# Patient Record
Sex: Male | Born: 1970 | Race: White | Hispanic: No | Marital: Married
Health system: Southern US, Community
[De-identification: ages and names within clinical notes are randomized; demographics above are authoritative.]

---

## 2014-06-26 ENCOUNTER — Emergency Department: Payer: Self-pay | Admitting: Emergency Medicine
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
## 2016-01-04 DIAGNOSIS — C4441 Basal cell carcinoma of skin of scalp and neck: Secondary | ICD-10-CM | POA: Diagnosis not present

## 2017-01-03 DIAGNOSIS — Z0000 Encounter for general adult medical examination without abnormal findings: Secondary | ICD-10-CM | POA: Diagnosis not present

## 2017-01-03 DIAGNOSIS — Z131 Encounter for screening for diabetes mellitus: Secondary | ICD-10-CM | POA: Diagnosis not present

## 2017-01-03 DIAGNOSIS — Z13220 Encounter for screening for lipoid disorders: Secondary | ICD-10-CM | POA: Diagnosis not present

## 2017-01-03 DIAGNOSIS — Z1211 Encounter for screening for malignant neoplasm of colon: Secondary | ICD-10-CM | POA: Diagnosis not present

## 2017-05-09 DIAGNOSIS — D225 Melanocytic nevi of trunk: Secondary | ICD-10-CM | POA: Diagnosis not present

## 2017-05-09 DIAGNOSIS — L821 Other seborrheic keratosis: Secondary | ICD-10-CM | POA: Diagnosis not present

## 2018-05-06 DIAGNOSIS — D1801 Hemangioma of skin and subcutaneous tissue: Secondary | ICD-10-CM | POA: Diagnosis not present

## 2018-05-06 DIAGNOSIS — L821 Other seborrheic keratosis: Secondary | ICD-10-CM | POA: Diagnosis not present

## 2018-05-06 DIAGNOSIS — D2239 Melanocytic nevi of other parts of face: Secondary | ICD-10-CM | POA: Diagnosis not present

## 2018-05-06 DIAGNOSIS — L820 Inflamed seborrheic keratosis: Secondary | ICD-10-CM | POA: Diagnosis not present

## 2018-05-06 DIAGNOSIS — D225 Melanocytic nevi of trunk: Secondary | ICD-10-CM | POA: Diagnosis not present

## 2018-12-05 DIAGNOSIS — J019 Acute sinusitis, unspecified: Secondary | ICD-10-CM | POA: Diagnosis not present

## 2018-12-05 DIAGNOSIS — B9689 Other specified bacterial agents as the cause of diseases classified elsewhere: Secondary | ICD-10-CM | POA: Diagnosis not present

## 2018-12-05 DIAGNOSIS — R52 Pain, unspecified: Secondary | ICD-10-CM | POA: Diagnosis not present

## 2018-12-05 DIAGNOSIS — J40 Bronchitis, not specified as acute or chronic: Secondary | ICD-10-CM | POA: Diagnosis not present

## 2019-09-23 DIAGNOSIS — K5730 Diverticulosis of large intestine without perforation or abscess without bleeding: Secondary | ICD-10-CM | POA: Diagnosis not present

## 2019-09-23 DIAGNOSIS — Z1211 Encounter for screening for malignant neoplasm of colon: Secondary | ICD-10-CM | POA: Diagnosis not present

## 2019-09-23 DIAGNOSIS — Z8371 Family history of colonic polyps: Secondary | ICD-10-CM | POA: Diagnosis not present

## 2019-09-29 ENCOUNTER — Ambulatory Visit: Payer: Self-pay | Admitting: Adult Health

## 2019-10-30 ENCOUNTER — Ambulatory Visit: Payer: Self-pay | Admitting: Adult Health

## 2019-11-04 DIAGNOSIS — D225 Melanocytic nevi of trunk: Secondary | ICD-10-CM | POA: Diagnosis not present

## 2019-11-04 DIAGNOSIS — L820 Inflamed seborrheic keratosis: Secondary | ICD-10-CM | POA: Diagnosis not present

## 2019-11-30 ENCOUNTER — Ambulatory Visit: Payer: Self-pay | Admitting: Adult Health

## 2020-02-11 DIAGNOSIS — L820 Inflamed seborrheic keratosis: Secondary | ICD-10-CM | POA: Diagnosis not present

## 2020-07-26 DIAGNOSIS — Z1152 Encounter for screening for COVID-19: Secondary | ICD-10-CM | POA: Diagnosis not present

## 2020-07-26 DIAGNOSIS — Z03818 Encounter for observation for suspected exposure to other biological agents ruled out: Secondary | ICD-10-CM | POA: Diagnosis not present

## 2020-08-26 DIAGNOSIS — Z125 Encounter for screening for malignant neoplasm of prostate: Secondary | ICD-10-CM | POA: Diagnosis not present

## 2020-08-26 DIAGNOSIS — Z7689 Persons encountering health services in other specified circumstances: Secondary | ICD-10-CM | POA: Diagnosis not present

## 2020-08-26 DIAGNOSIS — Z131 Encounter for screening for diabetes mellitus: Secondary | ICD-10-CM | POA: Diagnosis not present

## 2020-08-26 DIAGNOSIS — Z13220 Encounter for screening for lipoid disorders: Secondary | ICD-10-CM | POA: Diagnosis not present

## 2020-08-26 DIAGNOSIS — Z0000 Encounter for general adult medical examination without abnormal findings: Secondary | ICD-10-CM | POA: Diagnosis not present

## 2021-02-19 DIAGNOSIS — Z20822 Contact with and (suspected) exposure to covid-19: Secondary | ICD-10-CM | POA: Diagnosis not present

## 2021-02-28 DIAGNOSIS — S80861A Insect bite (nonvenomous), right lower leg, initial encounter: Secondary | ICD-10-CM | POA: Diagnosis not present

## 2021-02-28 DIAGNOSIS — Z6829 Body mass index (BMI) 29.0-29.9, adult: Secondary | ICD-10-CM | POA: Diagnosis not present

## 2021-02-28 DIAGNOSIS — L723 Sebaceous cyst: Secondary | ICD-10-CM | POA: Diagnosis not present

## 2021-02-28 DIAGNOSIS — W57XXXA Bitten or stung by nonvenomous insect and other nonvenomous arthropods, initial encounter: Secondary | ICD-10-CM | POA: Diagnosis not present

## 2021-05-17 DIAGNOSIS — D225 Melanocytic nevi of trunk: Secondary | ICD-10-CM | POA: Diagnosis not present

## 2021-05-17 DIAGNOSIS — L814 Other melanin hyperpigmentation: Secondary | ICD-10-CM | POA: Diagnosis not present

## 2021-05-17 DIAGNOSIS — L820 Inflamed seborrheic keratosis: Secondary | ICD-10-CM | POA: Diagnosis not present

## 2021-07-10 DIAGNOSIS — R42 Dizziness and giddiness: Secondary | ICD-10-CM | POA: Diagnosis not present

## 2021-07-10 DIAGNOSIS — H9313 Tinnitus, bilateral: Secondary | ICD-10-CM | POA: Diagnosis not present

## 2021-07-10 DIAGNOSIS — R110 Nausea: Secondary | ICD-10-CM | POA: Diagnosis not present

## 2021-07-10 DIAGNOSIS — F418 Other specified anxiety disorders: Secondary | ICD-10-CM | POA: Diagnosis not present

## 2021-07-11 DIAGNOSIS — H9313 Tinnitus, bilateral: Secondary | ICD-10-CM | POA: Diagnosis not present

## 2021-07-11 DIAGNOSIS — H8111 Benign paroxysmal vertigo, right ear: Secondary | ICD-10-CM | POA: Diagnosis not present

## 2021-07-11 DIAGNOSIS — H903 Sensorineural hearing loss, bilateral: Secondary | ICD-10-CM | POA: Diagnosis not present

## 2021-07-18 DIAGNOSIS — R42 Dizziness and giddiness: Secondary | ICD-10-CM | POA: Diagnosis not present

## 2021-10-31 DIAGNOSIS — L820 Inflamed seborrheic keratosis: Secondary | ICD-10-CM | POA: Diagnosis not present

## 2021-10-31 DIAGNOSIS — D485 Neoplasm of uncertain behavior of skin: Secondary | ICD-10-CM | POA: Diagnosis not present

## 2021-10-31 DIAGNOSIS — D225 Melanocytic nevi of trunk: Secondary | ICD-10-CM | POA: Diagnosis not present

## 2022-02-19 DIAGNOSIS — Z0000 Encounter for general adult medical examination without abnormal findings: Secondary | ICD-10-CM | POA: Diagnosis not present

## 2022-02-19 DIAGNOSIS — Z13220 Encounter for screening for lipoid disorders: Secondary | ICD-10-CM | POA: Diagnosis not present

## 2022-02-19 DIAGNOSIS — Z1211 Encounter for screening for malignant neoplasm of colon: Secondary | ICD-10-CM | POA: Diagnosis not present

## 2022-02-19 DIAGNOSIS — Z125 Encounter for screening for malignant neoplasm of prostate: Secondary | ICD-10-CM | POA: Diagnosis not present

## 2022-02-19 DIAGNOSIS — Z6829 Body mass index (BMI) 29.0-29.9, adult: Secondary | ICD-10-CM | POA: Diagnosis not present

## 2022-02-19 DIAGNOSIS — Z1331 Encounter for screening for depression: Secondary | ICD-10-CM | POA: Diagnosis not present

## 2022-02-19 DIAGNOSIS — Z131 Encounter for screening for diabetes mellitus: Secondary | ICD-10-CM | POA: Diagnosis not present

## 2022-05-28 DIAGNOSIS — L728 Other follicular cysts of the skin and subcutaneous tissue: Secondary | ICD-10-CM | POA: Diagnosis not present

## 2022-05-28 DIAGNOSIS — D225 Melanocytic nevi of trunk: Secondary | ICD-10-CM | POA: Diagnosis not present

## 2022-05-28 DIAGNOSIS — D2239 Melanocytic nevi of other parts of face: Secondary | ICD-10-CM | POA: Diagnosis not present

## 2022-05-28 DIAGNOSIS — L814 Other melanin hyperpigmentation: Secondary | ICD-10-CM | POA: Diagnosis not present

## 2022-07-24 DIAGNOSIS — Z6829 Body mass index (BMI) 29.0-29.9, adult: Secondary | ICD-10-CM | POA: Diagnosis not present

## 2022-07-24 DIAGNOSIS — M546 Pain in thoracic spine: Secondary | ICD-10-CM | POA: Diagnosis not present

## 2022-07-30 DIAGNOSIS — M9908 Segmental and somatic dysfunction of rib cage: Secondary | ICD-10-CM | POA: Diagnosis not present

## 2022-07-30 DIAGNOSIS — S2341XA Sprain of ribs, initial encounter: Secondary | ICD-10-CM | POA: Diagnosis not present

## 2022-07-30 DIAGNOSIS — S29012A Strain of muscle and tendon of back wall of thorax, initial encounter: Secondary | ICD-10-CM | POA: Diagnosis not present

## 2022-07-30 DIAGNOSIS — M62830 Muscle spasm of back: Secondary | ICD-10-CM | POA: Diagnosis not present

## 2022-07-30 DIAGNOSIS — M9903 Segmental and somatic dysfunction of lumbar region: Secondary | ICD-10-CM | POA: Diagnosis not present

## 2022-07-30 DIAGNOSIS — M9902 Segmental and somatic dysfunction of thoracic region: Secondary | ICD-10-CM | POA: Diagnosis not present

## 2022-08-02 DIAGNOSIS — M62830 Muscle spasm of back: Secondary | ICD-10-CM | POA: Diagnosis not present

## 2022-08-02 DIAGNOSIS — S29012A Strain of muscle and tendon of back wall of thorax, initial encounter: Secondary | ICD-10-CM | POA: Diagnosis not present

## 2022-08-02 DIAGNOSIS — M9902 Segmental and somatic dysfunction of thoracic region: Secondary | ICD-10-CM | POA: Diagnosis not present

## 2022-08-02 DIAGNOSIS — M9908 Segmental and somatic dysfunction of rib cage: Secondary | ICD-10-CM | POA: Diagnosis not present

## 2022-08-02 DIAGNOSIS — M9903 Segmental and somatic dysfunction of lumbar region: Secondary | ICD-10-CM | POA: Diagnosis not present

## 2022-08-02 DIAGNOSIS — S2341XA Sprain of ribs, initial encounter: Secondary | ICD-10-CM | POA: Diagnosis not present

## 2022-08-06 DIAGNOSIS — S2341XA Sprain of ribs, initial encounter: Secondary | ICD-10-CM | POA: Diagnosis not present

## 2022-08-06 DIAGNOSIS — M9902 Segmental and somatic dysfunction of thoracic region: Secondary | ICD-10-CM | POA: Diagnosis not present

## 2022-08-06 DIAGNOSIS — M9903 Segmental and somatic dysfunction of lumbar region: Secondary | ICD-10-CM | POA: Diagnosis not present

## 2022-08-06 DIAGNOSIS — M62830 Muscle spasm of back: Secondary | ICD-10-CM | POA: Diagnosis not present

## 2022-08-06 DIAGNOSIS — M9908 Segmental and somatic dysfunction of rib cage: Secondary | ICD-10-CM | POA: Diagnosis not present

## 2022-08-06 DIAGNOSIS — S29012A Strain of muscle and tendon of back wall of thorax, initial encounter: Secondary | ICD-10-CM | POA: Diagnosis not present

## 2022-08-07 DIAGNOSIS — F419 Anxiety disorder, unspecified: Secondary | ICD-10-CM | POA: Diagnosis not present

## 2022-08-08 DIAGNOSIS — M9908 Segmental and somatic dysfunction of rib cage: Secondary | ICD-10-CM | POA: Diagnosis not present

## 2022-08-08 DIAGNOSIS — M9902 Segmental and somatic dysfunction of thoracic region: Secondary | ICD-10-CM | POA: Diagnosis not present

## 2022-08-08 DIAGNOSIS — M62830 Muscle spasm of back: Secondary | ICD-10-CM | POA: Diagnosis not present

## 2022-08-08 DIAGNOSIS — S29012A Strain of muscle and tendon of back wall of thorax, initial encounter: Secondary | ICD-10-CM | POA: Diagnosis not present

## 2022-08-08 DIAGNOSIS — S2341XA Sprain of ribs, initial encounter: Secondary | ICD-10-CM | POA: Diagnosis not present

## 2022-08-08 DIAGNOSIS — M9903 Segmental and somatic dysfunction of lumbar region: Secondary | ICD-10-CM | POA: Diagnosis not present

## 2022-08-14 DIAGNOSIS — M9903 Segmental and somatic dysfunction of lumbar region: Secondary | ICD-10-CM | POA: Diagnosis not present

## 2022-08-14 DIAGNOSIS — S2341XA Sprain of ribs, initial encounter: Secondary | ICD-10-CM | POA: Diagnosis not present

## 2022-08-14 DIAGNOSIS — M62830 Muscle spasm of back: Secondary | ICD-10-CM | POA: Diagnosis not present

## 2022-08-14 DIAGNOSIS — M9902 Segmental and somatic dysfunction of thoracic region: Secondary | ICD-10-CM | POA: Diagnosis not present

## 2022-08-14 DIAGNOSIS — S29012A Strain of muscle and tendon of back wall of thorax, initial encounter: Secondary | ICD-10-CM | POA: Diagnosis not present

## 2022-08-14 DIAGNOSIS — M9908 Segmental and somatic dysfunction of rib cage: Secondary | ICD-10-CM | POA: Diagnosis not present

## 2022-08-22 DIAGNOSIS — R030 Elevated blood-pressure reading, without diagnosis of hypertension: Secondary | ICD-10-CM | POA: Diagnosis not present

## 2022-08-22 DIAGNOSIS — R009 Unspecified abnormalities of heart beat: Secondary | ICD-10-CM | POA: Diagnosis not present

## 2022-08-22 DIAGNOSIS — F419 Anxiety disorder, unspecified: Secondary | ICD-10-CM | POA: Diagnosis not present

## 2022-08-22 DIAGNOSIS — Z6828 Body mass index (BMI) 28.0-28.9, adult: Secondary | ICD-10-CM | POA: Diagnosis not present

## 2022-10-11 ENCOUNTER — Other Ambulatory Visit: Payer: Self-pay

## 2022-10-11 ENCOUNTER — Emergency Department
Admission: EM | Admit: 2022-10-11 | Discharge: 2022-10-11 | Disposition: A | Discharge status: Home / Self Care | Payer: BC Managed Care – PPO | Attending: Student in an Organized Health Care Education/Training Program | Admitting: Student in an Organized Health Care Education/Training Program

## 2022-10-11 ENCOUNTER — Emergency Department: Payer: BC Managed Care – PPO

## 2022-10-11 DIAGNOSIS — R079 Chest pain, unspecified: Secondary | ICD-10-CM | POA: Diagnosis not present

## 2022-10-11 DIAGNOSIS — R000 Tachycardia, unspecified: Secondary | ICD-10-CM | POA: Insufficient documentation

## 2022-10-11 LAB — BASIC METABOLIC PANEL
Anion gap: 8 (ref 5–15)
BUN: 16 mg/dL (ref 6–20)
CO2: 26 mmol/L (ref 22–32)
Calcium: 9.1 mg/dL (ref 8.9–10.3)
Chloride: 103 mmol/L (ref 98–111)
Creatinine, Ser: 0.92 mg/dL (ref 0.61–1.24)
GFR, Estimated: >60 mL/min (ref >60)
Glucose, Bld: 90 mg/dL (ref 70–99)
Potassium: 4.1 mmol/L (ref 3.5–5.1)
Sodium: 137 mmol/L (ref 135–145)

## 2022-10-11 LAB — CBC
HCT: 44 % (ref 39.0–52.0)
Hemoglobin: 15.6 g/dL (ref 13.0–17.0)
MCH: 32.6 pg (ref 26.0–34.0)
MCHC: 35.5 g/dL (ref 30.0–36.0)
MCV: 92.1 fL (ref 80.0–100.0)
Platelets: 256 10*3/uL (ref 150–400)
RBC: 4.78 MIL/uL (ref 4.22–5.81)
RDW: 11.4 % — ABNORMAL LOW (ref 11.5–15.5)
WBC: 9.7 10*3/uL (ref 4.0–10.5)
nRBC: 0 % (ref 0.0–0.2)

## 2022-10-11 LAB — TROPONIN I (HIGH SENSITIVITY): Troponin I (High Sensitivity): 3 ng/L (ref <18)

## 2022-10-11 NOTE — ED Provider Triage Note (Signed)
Emergency Medicine Provider Triage Evaluation Note  Dale Bass , a 52 y.o. male  was evaluated in triage.  Pt complains of tachycardia. While on the elliptical, heart rate suddenly jumped from 130s to 200. No chest pain or shortness of breath   Physical Exam  BP (!) 161/99 (BP Location: Right Arm)   Pulse 87   Resp 17   SpO2 98%  Gen:   Awake, no distress   Resp:  Normal effort  MSK:   Moves extremities without difficulty  Other:    Medical Decision Making  Medically screening exam initiated at 6:32 PM.  Appropriate orders placed.  Dale Bass was informed that the remainder of the evaluation will be completed by another provider, this initial triage assessment does not replace that evaluation, and the importance of remaining in the ED until their evaluation is complete.    Victorino Dike, FNP 10/11/22 [phone]

## 2022-10-11 NOTE — ED Triage Notes (Addendum)
Pt presents to ED with /co of having a high heart rate while working out. Pt states he was on the elliptical and states the machine told him his HR was in the 200's. Pt HR now is in the 80's. Pt denies CP and palpitations. Pt is A&Ox4, NAD noted.    Pt does state he recently started taking lexapro but stopped due to increased HR.   Pt does state HX of anxiety.

## 2022-10-11 NOTE — ED Provider Notes (Signed)
[facility] Provider Note   CSN: [id number] Arrival date & time: 10/11/22  1801     History  Chief Complaint  Patient presents with   Tachycardia    Dale Bass is a 52 y.o. male with medical history of anxiety presents to the emergency department for evaluation elevated heart rate.  Patient states earlier today, he was on a treadmill and his heart rate was being monitored by the treadmill.  He states he was jogging for 15 minutes and his heart rate stayed around 130s but then suddenly jumped up to the 200s, he describes 200-2 05.  Patient states he got off drink some water and returned to a different treadmill and again noticed his heart rate close to 200.  He denies any reports of chest pain shortness of breath dizziness fevers chills.  He does admit to having anxiety and is uncertain if he had a panic attack.  He denies any cardiac history.  At no point while on the treadmill and while his heart rate was elevated did he notice any symptoms other than palpitations.  He denies any nausea, vomiting, diaphoresis, back pain, abdominal pain.  Patient was placed on Lexapro by PCP 2 weeks ago for anxiety and whitecoat syndrome, patient took the medication daily for 1 week and did not like the way it made him feel so he discontinued the medication abruptly and has been off of it for 1 week.  HPI     Home Medications Prior to Admission medications   Not on File      Allergies    Patient has no known allergies.    Review of Systems   Review of Systems  Physical Exam Updated Vital Signs BP (!) 146/92   Pulse 77   Temp 98.4 F (36.9 C) (Oral)   Resp 16   Ht 5' 9.5" (1.765 m)   Wt 83.5 kg   SpO2 99%   BMI 26.78 kg/m  Physical Exam Constitutional:      Appearance: He is well-developed.  HENT:     Head: Normocephalic and atraumatic.  Eyes:     Conjunctiva/sclera: Conjunctivae normal.  Cardiovascular:     Rate and Rhythm:  Normal rate and regular rhythm.     Pulses: Normal pulses.     Heart sounds: Normal heart sounds. No murmur heard.    No friction rub. No gallop.  Pulmonary:     Effort: Pulmonary effort is normal. No respiratory distress.     Breath sounds: No wheezing or rales.  Abdominal:     General: Bowel sounds are normal. There is no distension.     Tenderness: There is no abdominal tenderness. There is no right CVA tenderness or guarding.  Musculoskeletal:        General: Normal range of motion.     Cervical back: Normal range of motion.  Skin:    General: Skin is warm.     Findings: No rash.  Neurological:     Mental Status: He is alert and oriented to person, place, and time.  Psychiatric:        Behavior: Behavior normal.        Thought Content: Thought content normal.     ED Results / Procedures / Treatments   Labs (all labs ordered are listed, but only abnormal results are displayed) Labs Reviewed  CBC - Abnormal; Notable for the following components:      Result Value   RDW 11.4 (*)  All other components within normal limits  BASIC METABOLIC PANEL  TROPONIN I (HIGH SENSITIVITY)  TROPONIN I (HIGH SENSITIVITY)    EKG None  Radiology DG Chest 2 View  Result Date: 10/11/2022 CLINICAL DATA:  Chest pain EXAM: CHEST - 2 VIEW COMPARISON:  None Available. FINDINGS: The heart size and mediastinal contours are within normal limits. Both lungs are clear. The visualized skeletal structures are unremarkable. Overlapping cardiac leads. Degenerative changes are seen along the spine. Electronically Signed   By: Jill Side M.D.   On: 10/11/2022 20:38    Procedures Procedures   Medications Ordered in ED Medications - No data to display  ED Course/ Medical Decision Making/ A&P                             Medical Decision Making Amount and/or Complexity of Data Reviewed Labs: ordered. Radiology: ordered.   52 year old male with elevated heart rate while exercising today.  No  chest pain, shortness of breath nausea or vomiting.  Uncertain how accurate workout equipment was that was monitoring his heart rate.  Patient currently asymptomatic.  Vital signs are stable.  Chest x-ray obtained and negative.  EKG normal as well as CBC, BMP and troponin.  Patient appears well in no distress.  Slightly elevated blood pressure but this is his baseline, states he has significant anxiety and whitecoat syndrome.  Patient was walked around the nurses station and vital signs remained stable with no elevation of heart rate greater than 70's.  Recommend pulse ox to monitor heart rate and follow-up with PCP if any elevation greater than and 100.  Return to the ER for any chest pain, shortness of breath, palpitations, dizziness lightheadedness or any urgent changes in his health.   Final Clinical Impression(s) / ED Diagnoses Final diagnoses:  Tachycardia    Rx / DC Orders ED Discharge Orders     None         Renata Caprice 10/11/22 2234    Merlyn Lot, MD 10/11/22 2250

## 2022-10-11 NOTE — ED Notes (Signed)
Pt ambulated a complete circuit around D pod. Vitals upon completion: p76, r16, BP 146/92, Spo2 99%.

## 2022-10-11 NOTE — Discharge Instructions (Signed)
Please call PCP and schedule follow-up appointment.  Continue to monitor heart rate while exercising.  If any chest pain or shortness of breath or dizziness return to the ER.

## 2022-10-12 DIAGNOSIS — F411 Generalized anxiety disorder: Secondary | ICD-10-CM | POA: Diagnosis not present

## 2022-10-17 DIAGNOSIS — R002 Palpitations: Secondary | ICD-10-CM | POA: Diagnosis not present

## 2022-10-17 DIAGNOSIS — R030 Elevated blood-pressure reading, without diagnosis of hypertension: Secondary | ICD-10-CM | POA: Diagnosis not present

## 2022-10-17 DIAGNOSIS — F411 Generalized anxiety disorder: Secondary | ICD-10-CM | POA: Diagnosis not present

## 2022-11-02 DIAGNOSIS — R202 Paresthesia of skin: Secondary | ICD-10-CM | POA: Diagnosis not present

## 2022-11-02 DIAGNOSIS — F411 Generalized anxiety disorder: Secondary | ICD-10-CM | POA: Diagnosis not present

## 2022-11-02 DIAGNOSIS — F4322 Adjustment disorder with anxiety: Secondary | ICD-10-CM | POA: Diagnosis not present

## 2022-11-02 DIAGNOSIS — G2581 Restless legs syndrome: Secondary | ICD-10-CM | POA: Diagnosis not present

## 2022-11-06 DIAGNOSIS — E538 Deficiency of other specified B group vitamins: Secondary | ICD-10-CM | POA: Diagnosis not present

## 2022-11-09 DIAGNOSIS — F4322 Adjustment disorder with anxiety: Secondary | ICD-10-CM | POA: Diagnosis not present

## 2022-11-23 DIAGNOSIS — F4322 Adjustment disorder with anxiety: Secondary | ICD-10-CM | POA: Diagnosis not present

## 2022-11-30 DIAGNOSIS — F411 Generalized anxiety disorder: Secondary | ICD-10-CM | POA: Diagnosis not present

## 2022-11-30 DIAGNOSIS — R030 Elevated blood-pressure reading, without diagnosis of hypertension: Secondary | ICD-10-CM | POA: Diagnosis not present

## 2022-11-30 DIAGNOSIS — E538 Deficiency of other specified B group vitamins: Secondary | ICD-10-CM | POA: Diagnosis not present

## 2022-11-30 DIAGNOSIS — R202 Paresthesia of skin: Secondary | ICD-10-CM | POA: Diagnosis not present

## 2022-12-21 DIAGNOSIS — F4322 Adjustment disorder with anxiety: Secondary | ICD-10-CM | POA: Diagnosis not present

## 2022-12-25 DIAGNOSIS — S2341XA Sprain of ribs, initial encounter: Secondary | ICD-10-CM | POA: Diagnosis not present

## 2022-12-25 DIAGNOSIS — M62830 Muscle spasm of back: Secondary | ICD-10-CM | POA: Diagnosis not present

## 2022-12-25 DIAGNOSIS — M9903 Segmental and somatic dysfunction of lumbar region: Secondary | ICD-10-CM | POA: Diagnosis not present

## 2022-12-25 DIAGNOSIS — M9908 Segmental and somatic dysfunction of rib cage: Secondary | ICD-10-CM | POA: Diagnosis not present

## 2022-12-25 DIAGNOSIS — S29012A Strain of muscle and tendon of back wall of thorax, initial encounter: Secondary | ICD-10-CM | POA: Diagnosis not present

## 2022-12-25 DIAGNOSIS — M9902 Segmental and somatic dysfunction of thoracic region: Secondary | ICD-10-CM | POA: Diagnosis not present

## 2022-12-27 DIAGNOSIS — M9908 Segmental and somatic dysfunction of rib cage: Secondary | ICD-10-CM | POA: Diagnosis not present

## 2022-12-27 DIAGNOSIS — S2341XA Sprain of ribs, initial encounter: Secondary | ICD-10-CM | POA: Diagnosis not present

## 2022-12-27 DIAGNOSIS — M9903 Segmental and somatic dysfunction of lumbar region: Secondary | ICD-10-CM | POA: Diagnosis not present

## 2022-12-27 DIAGNOSIS — M9902 Segmental and somatic dysfunction of thoracic region: Secondary | ICD-10-CM | POA: Diagnosis not present

## 2022-12-27 DIAGNOSIS — M62830 Muscle spasm of back: Secondary | ICD-10-CM | POA: Diagnosis not present

## 2022-12-27 DIAGNOSIS — S29012A Strain of muscle and tendon of back wall of thorax, initial encounter: Secondary | ICD-10-CM | POA: Diagnosis not present

## 2023-01-02 DIAGNOSIS — M9903 Segmental and somatic dysfunction of lumbar region: Secondary | ICD-10-CM | POA: Diagnosis not present

## 2023-01-02 DIAGNOSIS — M9902 Segmental and somatic dysfunction of thoracic region: Secondary | ICD-10-CM | POA: Diagnosis not present

## 2023-01-02 DIAGNOSIS — M9908 Segmental and somatic dysfunction of rib cage: Secondary | ICD-10-CM | POA: Diagnosis not present

## 2023-01-02 DIAGNOSIS — S2341XA Sprain of ribs, initial encounter: Secondary | ICD-10-CM | POA: Diagnosis not present

## 2023-01-02 DIAGNOSIS — M62830 Muscle spasm of back: Secondary | ICD-10-CM | POA: Diagnosis not present

## 2023-01-02 DIAGNOSIS — S29012A Strain of muscle and tendon of back wall of thorax, initial encounter: Secondary | ICD-10-CM | POA: Diagnosis not present

## 2023-01-03 DIAGNOSIS — M545 Low back pain, unspecified: Secondary | ICD-10-CM | POA: Diagnosis not present

## 2023-01-03 DIAGNOSIS — F411 Generalized anxiety disorder: Secondary | ICD-10-CM | POA: Diagnosis not present

## 2023-01-03 DIAGNOSIS — M47816 Spondylosis without myelopathy or radiculopathy, lumbar region: Secondary | ICD-10-CM | POA: Diagnosis not present

## 2023-01-03 DIAGNOSIS — R253 Fasciculation: Secondary | ICD-10-CM | POA: Diagnosis not present

## 2023-01-03 DIAGNOSIS — R202 Paresthesia of skin: Secondary | ICD-10-CM | POA: Diagnosis not present

## 2023-01-03 DIAGNOSIS — E538 Deficiency of other specified B group vitamins: Secondary | ICD-10-CM | POA: Diagnosis not present

## 2023-01-04 DIAGNOSIS — F4322 Adjustment disorder with anxiety: Secondary | ICD-10-CM | POA: Diagnosis not present

## 2023-01-18 DIAGNOSIS — F4322 Adjustment disorder with anxiety: Secondary | ICD-10-CM | POA: Diagnosis not present

## 2023-01-23 ENCOUNTER — Other Ambulatory Visit: Payer: Self-pay | Admitting: Neurology

## 2023-01-23 DIAGNOSIS — G479 Sleep disorder, unspecified: Secondary | ICD-10-CM | POA: Diagnosis not present

## 2023-01-23 DIAGNOSIS — R4589 Other symptoms and signs involving emotional state: Secondary | ICD-10-CM | POA: Diagnosis not present

## 2023-01-23 DIAGNOSIS — R253 Fasciculation: Secondary | ICD-10-CM | POA: Diagnosis not present

## 2023-01-23 DIAGNOSIS — R202 Paresthesia of skin: Secondary | ICD-10-CM

## 2023-02-07 ENCOUNTER — Encounter: Payer: Self-pay | Admitting: Neurology

## 2023-02-13 ENCOUNTER — Ambulatory Visit
Admission: RE | Admit: 2023-02-13 | Discharge: 2023-02-13 | Disposition: A | Discharge status: Home / Self Care | Payer: BC Managed Care – PPO | Source: Ambulatory Visit | Attending: Neurology | Admitting: Neurology

## 2023-02-13 DIAGNOSIS — R253 Fasciculation: Secondary | ICD-10-CM

## 2023-02-13 DIAGNOSIS — R202 Paresthesia of skin: Secondary | ICD-10-CM

## 2023-02-25 DIAGNOSIS — R202 Paresthesia of skin: Secondary | ICD-10-CM | POA: Diagnosis not present

## 2023-03-07 DIAGNOSIS — R200 Anesthesia of skin: Secondary | ICD-10-CM | POA: Diagnosis not present

## 2023-03-07 DIAGNOSIS — F333 Major depressive disorder, recurrent, severe with psychotic symptoms: Secondary | ICD-10-CM | POA: Diagnosis not present

## 2023-03-07 DIAGNOSIS — D518 Other vitamin B12 deficiency anemias: Secondary | ICD-10-CM | POA: Diagnosis not present

## 2023-03-07 DIAGNOSIS — R4589 Other symptoms and signs involving emotional state: Secondary | ICD-10-CM | POA: Diagnosis not present

## 2023-03-07 DIAGNOSIS — R253 Fasciculation: Secondary | ICD-10-CM | POA: Diagnosis not present

## 2023-03-07 DIAGNOSIS — F411 Generalized anxiety disorder: Secondary | ICD-10-CM | POA: Diagnosis not present

## 2023-03-07 DIAGNOSIS — F429 Obsessive-compulsive disorder, unspecified: Secondary | ICD-10-CM | POA: Diagnosis not present

## 2023-03-07 DIAGNOSIS — F5105 Insomnia due to other mental disorder: Secondary | ICD-10-CM | POA: Diagnosis not present

## 2023-03-15 DIAGNOSIS — F429 Obsessive-compulsive disorder, unspecified: Secondary | ICD-10-CM | POA: Diagnosis not present

## 2023-03-15 DIAGNOSIS — F333 Major depressive disorder, recurrent, severe with psychotic symptoms: Secondary | ICD-10-CM | POA: Diagnosis not present

## 2023-03-15 DIAGNOSIS — F411 Generalized anxiety disorder: Secondary | ICD-10-CM | POA: Diagnosis not present

## 2023-03-15 DIAGNOSIS — D518 Other vitamin B12 deficiency anemias: Secondary | ICD-10-CM | POA: Diagnosis not present

## 2023-03-25 DIAGNOSIS — F333 Major depressive disorder, recurrent, severe with psychotic symptoms: Secondary | ICD-10-CM | POA: Diagnosis not present

## 2023-03-25 DIAGNOSIS — F411 Generalized anxiety disorder: Secondary | ICD-10-CM | POA: Diagnosis not present

## 2023-03-30 DIAGNOSIS — F429 Obsessive-compulsive disorder, unspecified: Secondary | ICD-10-CM | POA: Diagnosis not present

## 2023-03-30 DIAGNOSIS — F333 Major depressive disorder, recurrent, severe with psychotic symptoms: Secondary | ICD-10-CM | POA: Diagnosis not present

## 2023-03-30 DIAGNOSIS — D518 Other vitamin B12 deficiency anemias: Secondary | ICD-10-CM | POA: Diagnosis not present

## 2023-03-30 DIAGNOSIS — F411 Generalized anxiety disorder: Secondary | ICD-10-CM | POA: Diagnosis not present

## 2023-04-18 DIAGNOSIS — R200 Anesthesia of skin: Secondary | ICD-10-CM | POA: Diagnosis not present

## 2023-04-18 DIAGNOSIS — R4589 Other symptoms and signs involving emotional state: Secondary | ICD-10-CM | POA: Diagnosis not present

## 2023-04-18 DIAGNOSIS — R253 Fasciculation: Secondary | ICD-10-CM | POA: Diagnosis not present

## 2023-04-18 DIAGNOSIS — R202 Paresthesia of skin: Secondary | ICD-10-CM | POA: Diagnosis not present

## 2023-04-19 DIAGNOSIS — F411 Generalized anxiety disorder: Secondary | ICD-10-CM | POA: Diagnosis not present

## 2023-04-19 DIAGNOSIS — R253 Fasciculation: Secondary | ICD-10-CM | POA: Diagnosis not present

## 2023-04-19 DIAGNOSIS — Z0000 Encounter for general adult medical examination without abnormal findings: Secondary | ICD-10-CM | POA: Diagnosis not present

## 2023-04-19 DIAGNOSIS — Z79899 Other long term (current) drug therapy: Secondary | ICD-10-CM | POA: Diagnosis not present

## 2023-04-19 DIAGNOSIS — R202 Paresthesia of skin: Secondary | ICD-10-CM | POA: Diagnosis not present

## 2023-04-19 DIAGNOSIS — E538 Deficiency of other specified B group vitamins: Secondary | ICD-10-CM | POA: Diagnosis not present

## 2023-04-19 DIAGNOSIS — Z114 Encounter for screening for human immunodeficiency virus [HIV]: Secondary | ICD-10-CM | POA: Diagnosis not present

## 2023-04-19 DIAGNOSIS — G2581 Restless legs syndrome: Secondary | ICD-10-CM | POA: Diagnosis not present

## 2023-04-19 DIAGNOSIS — Z125 Encounter for screening for malignant neoplasm of prostate: Secondary | ICD-10-CM | POA: Diagnosis not present

## 2023-05-21 DIAGNOSIS — F429 Obsessive-compulsive disorder, unspecified: Secondary | ICD-10-CM | POA: Diagnosis not present

## 2023-05-21 DIAGNOSIS — F411 Generalized anxiety disorder: Secondary | ICD-10-CM | POA: Diagnosis not present

## 2023-05-21 DIAGNOSIS — D518 Other vitamin B12 deficiency anemias: Secondary | ICD-10-CM | POA: Diagnosis not present

## 2023-05-21 DIAGNOSIS — F333 Major depressive disorder, recurrent, severe with psychotic symptoms: Secondary | ICD-10-CM | POA: Diagnosis not present

## 2023-05-22 DIAGNOSIS — L578 Other skin changes due to chronic exposure to nonionizing radiation: Secondary | ICD-10-CM | POA: Diagnosis not present

## 2023-05-22 DIAGNOSIS — L820 Inflamed seborrheic keratosis: Secondary | ICD-10-CM | POA: Diagnosis not present

## 2023-05-22 DIAGNOSIS — D225 Melanocytic nevi of trunk: Secondary | ICD-10-CM | POA: Diagnosis not present

## 2023-05-22 DIAGNOSIS — L814 Other melanin hyperpigmentation: Secondary | ICD-10-CM | POA: Diagnosis not present

## 2023-07-22 DIAGNOSIS — F333 Major depressive disorder, recurrent, severe with psychotic symptoms: Secondary | ICD-10-CM | POA: Diagnosis not present

## 2023-07-22 DIAGNOSIS — F429 Obsessive-compulsive disorder, unspecified: Secondary | ICD-10-CM | POA: Diagnosis not present

## 2023-07-22 DIAGNOSIS — D518 Other vitamin B12 deficiency anemias: Secondary | ICD-10-CM | POA: Diagnosis not present

## 2023-07-22 DIAGNOSIS — F411 Generalized anxiety disorder: Secondary | ICD-10-CM | POA: Diagnosis not present

## 2023-07-23 DIAGNOSIS — R030 Elevated blood-pressure reading, without diagnosis of hypertension: Secondary | ICD-10-CM | POA: Diagnosis not present

## 2023-07-23 DIAGNOSIS — F411 Generalized anxiety disorder: Secondary | ICD-10-CM | POA: Diagnosis not present
